# Patient Record
Sex: Female | Born: 1975 | Race: White | Hispanic: No | Marital: Single | Smoking: Never smoker
Health system: Southern US, Academic
[De-identification: ages and names within clinical notes are randomized; demographics above are authoritative.]

## PROBLEM LIST (undated history)

## (undated) DIAGNOSIS — IMO0002 Reserved for concepts with insufficient information to code with codable children: Secondary | ICD-10-CM

## (undated) DIAGNOSIS — N760 Acute vaginitis: Secondary | ICD-10-CM

## (undated) DIAGNOSIS — R51 Headache: Secondary | ICD-10-CM

## (undated) DIAGNOSIS — A64 Unspecified sexually transmitted disease: Secondary | ICD-10-CM

## (undated) DIAGNOSIS — R87619 Unspecified abnormal cytological findings in specimens from cervix uteri: Secondary | ICD-10-CM

## (undated) DIAGNOSIS — N879 Dysplasia of cervix uteri, unspecified: Secondary | ICD-10-CM

## (undated) HISTORY — DX: Acute vaginitis: N76.0

## (undated) HISTORY — DX: Dysplasia of cervix uteri, unspecified: N87.9

## (undated) HISTORY — DX: Headache: R51

## (undated) HISTORY — DX: Unspecified abnormal cytological findings in specimens from cervix uteri: R87.619

## (undated) HISTORY — DX: Unspecified sexually transmitted disease: A64

## (undated) HISTORY — DX: Reserved for concepts with insufficient information to code with codable children: IMO0002

---

## 1996-11-04 HISTORY — PX: HX CRYOSURGERY OF CERVIX: 2100001332

## 1998-11-04 HISTORY — PX: HX LEEP PROCEDURE: SHX91

## 2013-01-08 ENCOUNTER — Encounter (INDEPENDENT_AMBULATORY_CARE_PROVIDER_SITE_OTHER): Payer: Self-pay | Admitting: Women's Health

## 2013-01-08 ENCOUNTER — Ambulatory Visit (INDEPENDENT_AMBULATORY_CARE_PROVIDER_SITE_OTHER): Payer: BC Managed Care – PPO | Admitting: Women's Health

## 2013-01-08 VITALS — BP 116/74 | Ht 68.0 in | Wt 198.0 lb

## 2013-01-08 DIAGNOSIS — N889 Noninflammatory disorder of cervix uteri, unspecified: Secondary | ICD-10-CM | POA: Insufficient documentation

## 2013-01-08 LAB — PC POCT URINE PREG TEST, VISUAL: PREGNANCY, URINE: NEGATIVE

## 2013-01-08 NOTE — Procedures (Signed)
See scanned colposcopy sheet

## 2013-01-08 NOTE — Progress Notes (Signed)
See scanned colposcopy sheet

## 2013-01-19 ENCOUNTER — Encounter (INDEPENDENT_AMBULATORY_CARE_PROVIDER_SITE_OTHER): Payer: Self-pay | Admitting: Women's Health

## 2013-08-05 ENCOUNTER — Encounter (INDEPENDENT_AMBULATORY_CARE_PROVIDER_SITE_OTHER): Payer: Self-pay | Admitting: Women's Health

## 2013-09-02 ENCOUNTER — Encounter (INDEPENDENT_AMBULATORY_CARE_PROVIDER_SITE_OTHER): Payer: Self-pay | Admitting: Women's Health

## 2014-03-11 ENCOUNTER — Encounter (INDEPENDENT_AMBULATORY_CARE_PROVIDER_SITE_OTHER): Payer: Self-pay | Admitting: Women's Health

## 2014-05-23 ENCOUNTER — Other Ambulatory Visit (INDEPENDENT_AMBULATORY_CARE_PROVIDER_SITE_OTHER): Payer: Self-pay | Admitting: Women's Health

## 2014-05-23 MED ORDER — VALACYCLOVIR 500 MG TABLET
500.00 mg | ORAL_TABLET | Freq: Two times a day (BID) | ORAL | Status: DC
Start: 2014-05-23 — End: 2015-07-17

## 2014-05-23 NOTE — Telephone Encounter (Signed)
Pt calling states Dr. Vivia Birminghamepond diagnosed her with herpes in 2001 and she takes valtrex as needed for outbreaks in the vaginal area she states she is having one now and would like some called in

## 2014-06-21 ENCOUNTER — Ambulatory Visit (INDEPENDENT_AMBULATORY_CARE_PROVIDER_SITE_OTHER): Payer: BC Managed Care – PPO | Admitting: Women's Health

## 2014-06-21 ENCOUNTER — Encounter (INDEPENDENT_AMBULATORY_CARE_PROVIDER_SITE_OTHER): Payer: Self-pay | Admitting: Women's Health

## 2014-06-21 VITALS — BP 122/80 | Ht 68.0 in | Wt 207.0 lb

## 2014-06-21 DIAGNOSIS — Z01419 Encounter for gynecological examination (general) (routine) without abnormal findings: Secondary | ICD-10-CM

## 2014-06-21 DIAGNOSIS — A609 Anogenital herpesviral infection, unspecified: Secondary | ICD-10-CM

## 2014-06-21 DIAGNOSIS — B009 Herpesviral infection, unspecified: Secondary | ICD-10-CM

## 2014-06-21 MED ORDER — VALACYCLOVIR 500 MG TABLET
500.00 mg | ORAL_TABLET | Freq: Two times a day (BID) | ORAL | Status: DC
Start: 2014-06-21 — End: 2015-07-17

## 2014-06-21 NOTE — Progress Notes (Signed)
[facility]  [facility]    [facility] [place]  Date of Service: 06/21/2014    Chief Complaint:   Chief Complaint   Patient presents with    Annual Pap     NO ASSIST       History  HPI    Patient into office for annual exam.  Denies complaints.  Requesting refill on valtrex.  Not sexually active at this time    Review of Systems   Constitutional: Negative.    HENT: Negative.    Eyes: Negative.    Respiratory: Negative.    Cardiovascular: Negative.    Gastrointestinal: Negative.    Endocrine: Negative.    Genitourinary: Negative.    Musculoskeletal: Negative.    Skin: Negative.    Allergic/Immunologic: Negative.    Neurological: Negative.    Hematological: Negative.    Psychiatric/Behavioral: Negative.        Examination  Vitals: BP 122/80 mmHg   Ht 1.727 m (5\' 8" )   Wt 93.895 kg (207 lb)   BMI 31.48 kg/m2   LMP 06/16/2014 (Exact Date)  Physical Exam   Constitutional: She is oriented to person, place, and time. She appears well-developed and well-nourished.   HENT:   Head: Normocephalic.   Neck: Normal range of motion. No thyromegaly present.   Cardiovascular: Normal rate, regular rhythm and normal heart sounds.    No murmur heard.  Pulmonary/Chest: Effort normal and breath sounds normal. Right breast exhibits no mass, no nipple discharge, no skin change and no tenderness. Left breast exhibits no mass, no nipple discharge, no skin change and no tenderness.   Abdominal: Soft. There is no tenderness.   Genitourinary: Uterus normal. Pelvic exam was performed with patient supine. There is no lesion on the right labia. There is no lesion on the left labia. Cervix exhibits no motion tenderness. Right adnexum displays no mass, no tenderness and no fullness. Left adnexum displays no mass, no tenderness and no fullness. There is bleeding in the vagina. No vaginal discharge found.   Musculoskeletal: Normal range of motion.   Lymphadenopathy:        Right: No inguinal adenopathy present.        Left: No inguinal  adenopathy present.   Neurological: She is alert and oriented to person, place, and time.   Skin: Skin is warm and dry.   Psychiatric: She has a normal mood and affect. Her behavior is normal. Judgment and thought content normal.   Vitals reviewed.    Ortho Exam    Results    Diagnosis and Plan  1. Routine gynecological examination    2. HSV (herpes simplex virus) anogenital infection      Orders Placed This Encounter    valACYclovir (VALTREX) 500 mg Oral Tablet    CYTOPATHOLOGY-GYN (PAP AND HPV TESTS)       Bethena MidgetMichelle Maurizio Geno, NP

## 2014-06-27 ENCOUNTER — Other Ambulatory Visit (INDEPENDENT_AMBULATORY_CARE_PROVIDER_SITE_OTHER): Payer: Self-pay

## 2014-06-27 DIAGNOSIS — Z01419 Encounter for gynecological examination (general) (routine) without abnormal findings: Secondary | ICD-10-CM

## 2015-07-17 ENCOUNTER — Other Ambulatory Visit (INDEPENDENT_AMBULATORY_CARE_PROVIDER_SITE_OTHER): Payer: Self-pay | Admitting: Women's Health

## 2015-07-17 DIAGNOSIS — A609 Anogenital herpesviral infection, unspecified: Secondary | ICD-10-CM

## 2015-07-17 NOTE — Telephone Encounter (Signed)
Pt needs refill on Vatrex 500 mg she states the last time you sent RX it was BID. She would like to know if she could get the RX for TID? She states the last time she had to go through 2 RXs before everything cleared up.

## 2015-07-18 MED ORDER — VALACYCLOVIR 500 MG TABLET
500.0000 mg | ORAL_TABLET | Freq: Two times a day (BID) | ORAL | Status: DC
Start: 2015-07-18 — End: 2023-05-20

## 2021-11-06 ENCOUNTER — Ambulatory Visit (HOSPITAL_COMMUNITY): Payer: Self-pay | Admitting: Internal Medicine

## 2021-12-27 ENCOUNTER — Ambulatory Visit (RURAL_HEALTH_CENTER): Payer: Self-pay | Admitting: INTERNAL MEDICINE-ENDOCRINOLOGY-DIABETES AND METABOLISM

## 2023-03-10 ENCOUNTER — Telehealth (HOSPITAL_BASED_OUTPATIENT_CLINIC_OR_DEPARTMENT_OTHER): Payer: Self-pay | Admitting: Internal Medicine

## 2023-03-10 DIAGNOSIS — E0500 Thyrotoxicosis with diffuse goiter without thyrotoxic crisis or storm: Secondary | ICD-10-CM

## 2023-03-10 NOTE — Telephone Encounter (Signed)
Wants lab  results on the way from [facility]

## 2023-03-17 ENCOUNTER — Ambulatory Visit (HOSPITAL_BASED_OUTPATIENT_CLINIC_OR_DEPARTMENT_OTHER): Payer: Self-pay | Admitting: Internal Medicine

## 2023-05-26 ENCOUNTER — Encounter (HOSPITAL_BASED_OUTPATIENT_CLINIC_OR_DEPARTMENT_OTHER): Payer: Self-pay | Admitting: Internal Medicine

## 2023-05-26 ENCOUNTER — Ambulatory Visit: Payer: BC Managed Care – PPO | Attending: Internal Medicine | Admitting: Internal Medicine

## 2023-05-26 ENCOUNTER — Other Ambulatory Visit: Payer: Self-pay

## 2023-05-26 VITALS — BP 126/63 | HR 67 | Temp 97.0°F | Ht 68.0 in | Wt 172.0 lb

## 2023-05-26 DIAGNOSIS — E0500 Thyrotoxicosis with diffuse goiter without thyrotoxic crisis or storm: Secondary | ICD-10-CM

## 2023-05-26 DIAGNOSIS — Z8349 Family history of other endocrine, nutritional and metabolic diseases: Secondary | ICD-10-CM

## 2023-05-26 DIAGNOSIS — E042 Nontoxic multinodular goiter: Secondary | ICD-10-CM

## 2023-05-26 DIAGNOSIS — E559 Vitamin D deficiency, unspecified: Secondary | ICD-10-CM

## 2023-05-26 DIAGNOSIS — Z79890 Hormone replacement therapy (postmenopausal): Secondary | ICD-10-CM

## 2023-05-26 NOTE — Progress Notes (Signed)
[facility]  [address]  [place] [address]  Operated by [facility]  Progress Note    Name: Stephanie Shelton MRN:  [id number]   Date: 05/26/2023 DOB:  12/14/1975 (47 y.o.)              ENDOCRINOLOGY OUTPATIENT PROGRESS NOTE    Name: Stephanie [facility]  Age: 47 y.o., Sex: Female  MRN: [id number]    Referred by: No ref. 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 with high tsi on methimazole since jan 2023   noticed heart rate high went to dr no previous history of thyroid disease   labs in november showed tsh low ft4 2.1 ft3 high   since last visit 42m ago  no hospitalization no surgery taking methimazole 2.5mg  dose adjusted from 5mg  daily 45m ago   has family history of thyroid disease mom has thyroid nodule no family history of thyroid cancer and no history of irradiation to head and neck   last menstrual march 2022 is in menopause based on labs by obgyn and started estrace vaginal cream      Last ultrasound 07/13/22      Findings rt and left lobe  heterogenous   thyroid nodule rt side 5mm stable      Thyroid symptoms    Signs/Symptoms Yes No Comments   Hair/Skin/Nail Changes  x    Compressive Symptoms  x    Fatigue  x    Weakness   x    Edema  x    Weight Changes x  Gained 5lb    Menstrual Irregularities x  No period since march 2022    Bowel Changes  x    Heat/Cold Intolerance  x    Palpitations  x    Tremors  x    Neck Tenderness  x    Increased Neck Size  x    Diplopia  x    Scleral Injection      Proptosis      Dry Eyes      No insomnia no anxiety         Review of Systems  As per h&P    Allergies  Allergies   Allergen Reactions    Tetracyclines Mental Status Effect        Medication History   Current Outpatient Medications   Medication Sig    ergocalciferol, vitamin D2, (DRISDOL) 1,250 mcg (50,000 unit) Oral Capsule Take 1 Capsule (50,000 Units  total) by mouth Every 7 days    estradioL (ESTRACE) 0.01 % (0.1 mg/gram) Vaginal Cream insert 1 gram vaginally at bedtime nightly x 2 weeks then 2-3 times weekly as a maintenance    methIMAzole (TAPAZOLE) 5 mg Oral Tablet Take 0.5 Tablets (2.5 mg total) by mouth Once a day        OBJECTIVE:     Vitals:    05/26/23 1406   BP: 126/63   Pulse: 67   Temp: 36.1 C (97 F)   SpO2: 99%   Weight: 78 kg (172 lb)   Height: 1.727 m (5\' 8" )   BMI: 26.21       Physical Exam  Constitutional:       Appearance: Normal appearance. She is normal weight.   HENT:      Head: Normocephalic.   Neck:  Thyroid: Thyromegaly (firm and irregular) present. No thyroid mass or thyroid tenderness.   Cardiovascular:      Rate and Rhythm: Normal rate and regular rhythm.      Heart sounds: Normal heart sounds. No murmur heard.  Pulmonary:      Effort: Pulmonary effort is normal.      Breath sounds: Normal breath sounds.   Lymphadenopathy:      Cervical: No cervical adenopathy.   Neurological:      General: No focal deficit present.      Mental Status: She is alert and oriented to person, place, and time.      Cranial Nerves: No cranial nerve deficit.      Motor: No tremor.   Psychiatric:         Attention and Perception: Attention normal.         Mood and Affect: Mood normal.         Speech: Speech normal.         Behavior: Behavior normal. Behavior is cooperative.         Cognition and Memory: Cognition and memory normal.         Judgment: Judgment normal.          Lab Investigations and Imaging  .April 29 TSH 1.9  wbc 6.0 ast 15     ASSESSMENT & PLAN:   (E05.00) Graves' disease  (primary encounter diagnosis)  Plan: THYROXINE, FREE (FREE T4), THYROID STIMULATING         HORMONE WITH FREE T4 REFLEX, THYROXINE, FREE         (FREE T4), THYROID STIMULATING HORMONE WITH         FREE T4 REFLEX    (E55.9) Vitamin D deficiency    (E04.2) Multinodular goiter       ICD-10-CM    1. Graves' disease  E05.00 THYROXINE, FREE (FREE T4)     THYROID STIMULATING  HORMONE WITH FREE T4 REFLEX     THYROXINE, FREE (FREE T4)     THYROID STIMULATING HORMONE WITH FREE T4 REFLEX      2. Vitamin D deficiency  E55.9       3. Multinodular goiter  E04.2        Doing well will stop methimazole   Repeat tfts in 18m and call   RTC 80m with labs       Return in about 6 months (around 11/26/2023) for In Person Visit.    Andreas Blower, MD

## 2023-11-19 IMAGING — MR MRI SHOULDER LT W/O CONTRAST
4 of 7 series · 27 of 40 positions shown · IV contrast (gadolinium)
Comparison: Two views of the left shoulder dated 11/19/2023.

﻿EXAM:  [id number]   MRI SHOULDER LT W/O CONTRAST
INDICATION: 47-year-old with persistent left shoulder pain and diminished range of motion.  Possible internal derangement.  No prior history of shoulder surgery. History of C-spine fracture.
TECHNIQUE: Multiplanar, multisequential MRI of the left shoulder was performed without gadolinium contrast.

[Series 6: T1 · oblique · left · 4.5mm · 0.38mm/px · 7 of 20 slices shown]
[im 1/20]
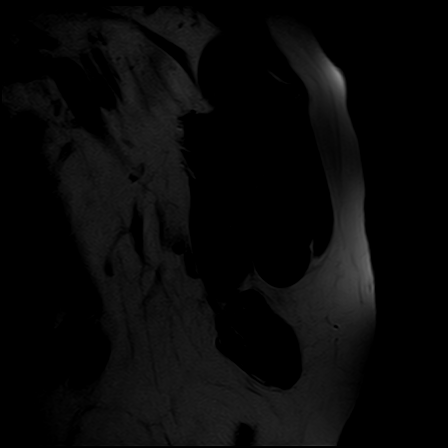
[im 4/20]
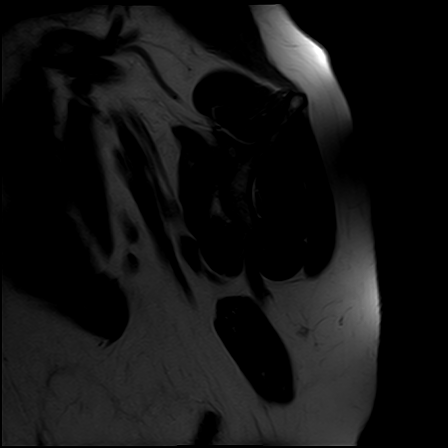
[im 7/20]
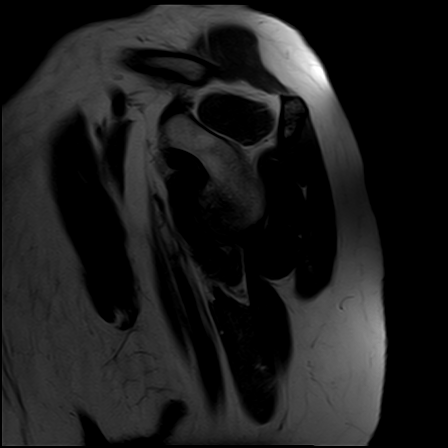
[im 10/20]
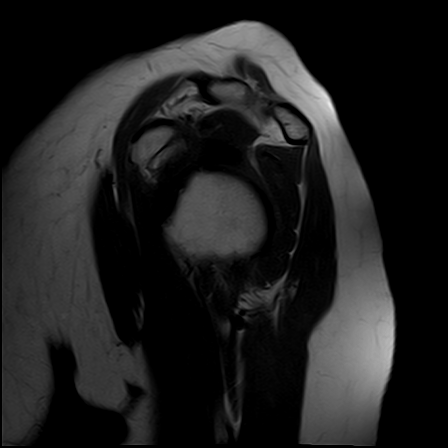
[im 13/20]
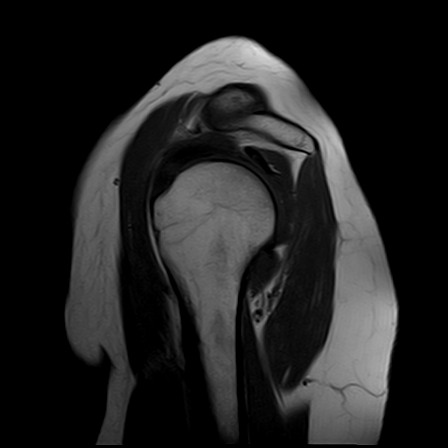
[im 16/20]
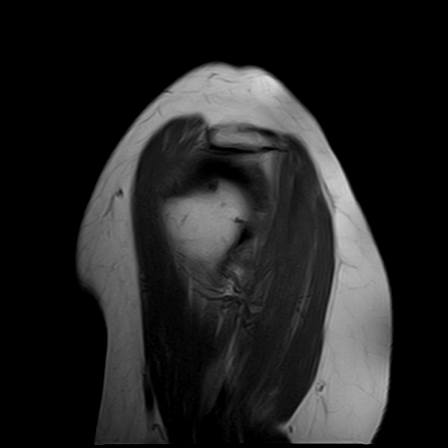
[im 20/20]
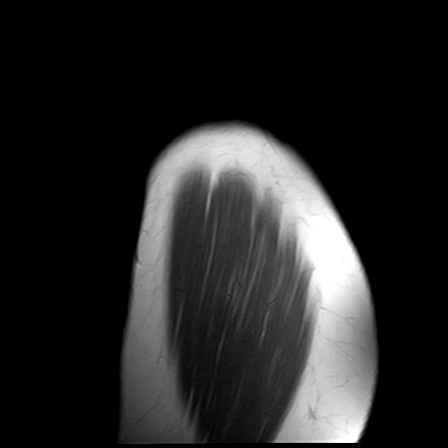

[Series 7: PD fat-sat · axial · left · 5.5mm · 0.50mm/px · z∈[-72,+12]mm · 6 of 18 slices shown (1 of 2)]
[im 1/18]
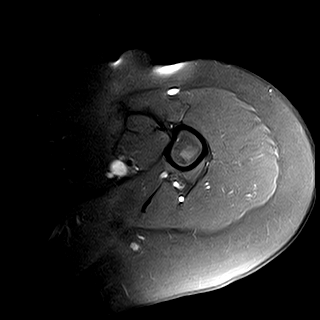
[im 4/18]
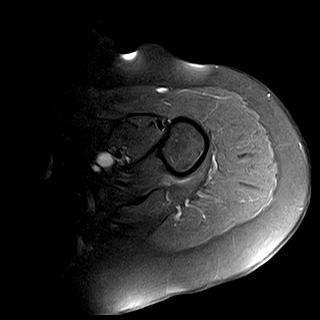
[im 7/18]
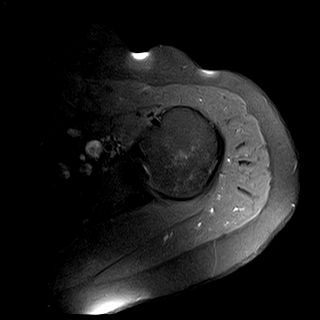
[im 11/18]
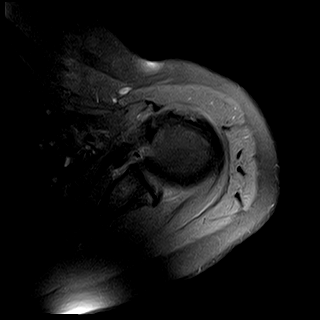
[im 14/18]
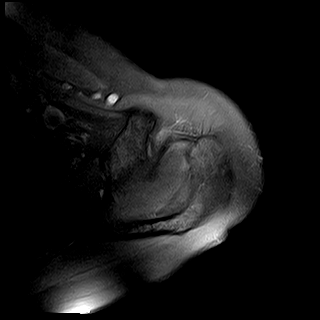
[im 18/18]
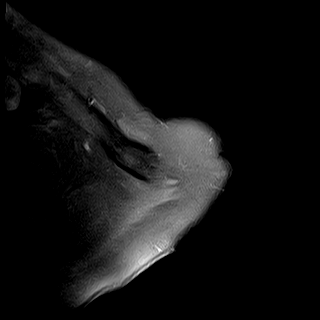

[Series 9: T2 fat-sat · axial · left · 4.0mm · 0.42mm/px · z∈[-67,+19]mm · 8 of 24 slices shown]
[im 1/24]
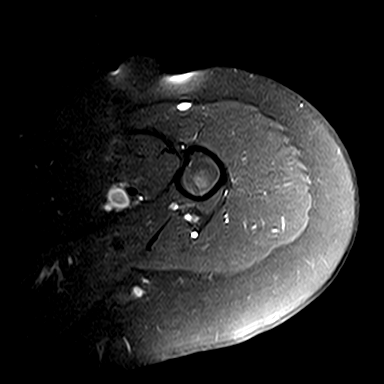
[im 4/24]
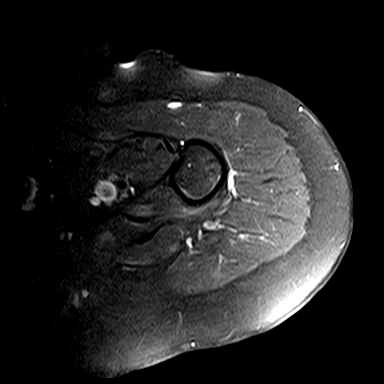
[im 7/24]
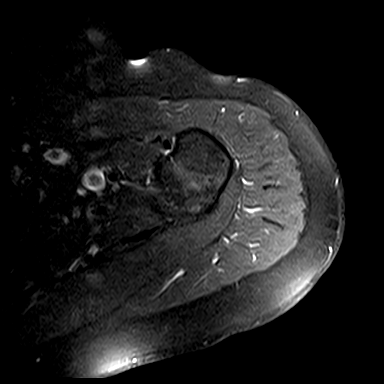
[im 10/24]
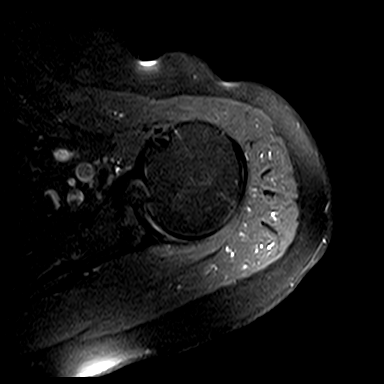
[im 14/24]
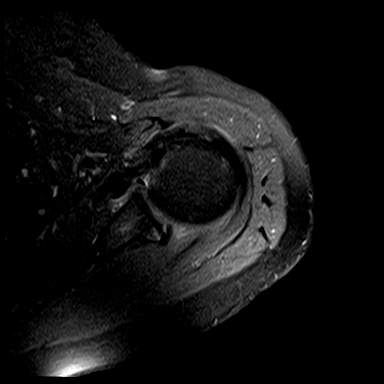
[im 17/24]
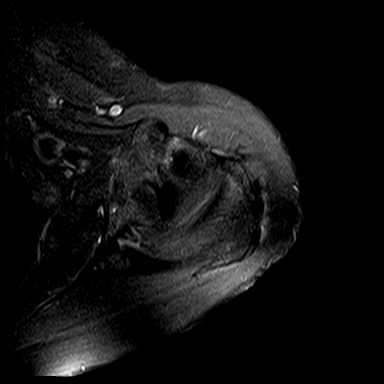
[im 20/24]
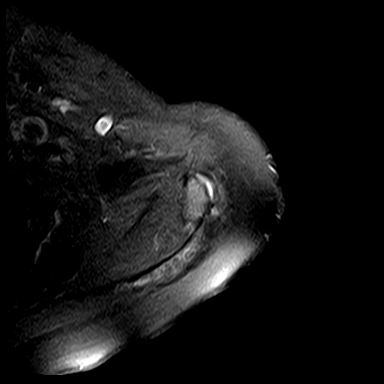
[im 24/24]
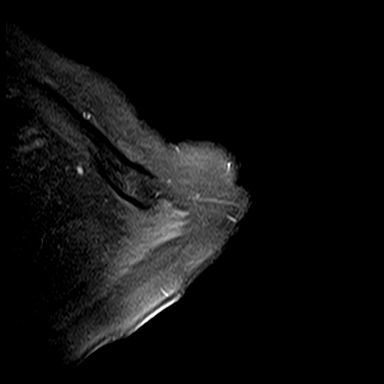

[Series 10: PD fat-sat · coronal · left · 4.5mm · 0.47mm/px · 6 of 18 slices shown (2 of 2)]
[im 1/18]
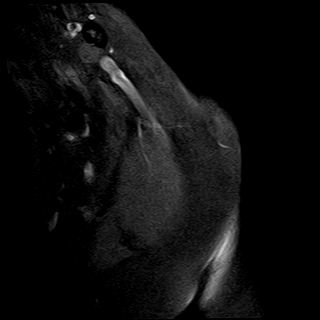
[im 4/18]
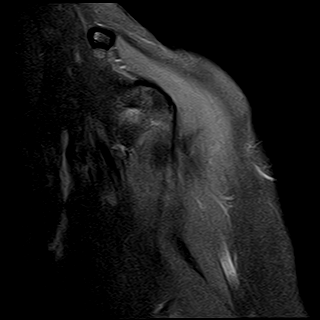
[im 7/18]
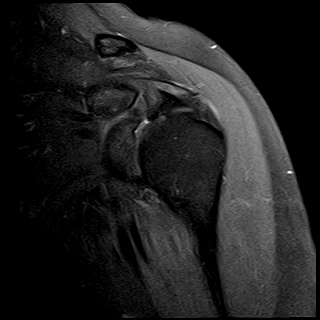
[im 11/18]
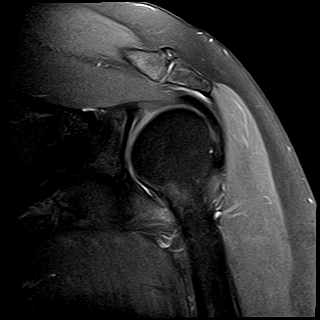
[im 14/18]
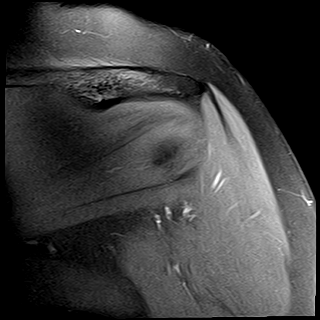
[im 18/18]
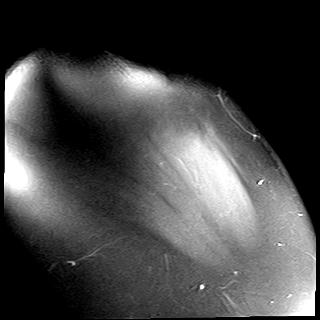

[27 of 40 positions shown; findings below may reference images not displayed]

FINDINGS: No acute bony lesions at the left shoulder.  Hook shaped acromion process is mildly impinging on the subacromion space and the supraspinatus.  Mild supraspinatus tendinitis.  Minimal fluid in the subacromion bursa.

No evidence of glenoid labral tear. Mild degenerative changes of the anterior lip of the glenoid labrum, without evidence of labral tear. Long head of biceps tendon is intact.
IMPRESSION: 1. No acute bony lesions at the left shoulder.

2. Hook shaped acromion process is mildly impinging on the subacromion space and the supraspinatus.  Mild supraspinatus tendinitis.  No evidence of rotator cuff tear.

3. Mild degenerative changes of the anterior lip of the glenoid labrum.  No evidence of labral tear.  Biceps tendon is intact.

## 2023-11-26 ENCOUNTER — Ambulatory Visit: Payer: BC Managed Care – PPO | Attending: Internal Medicine | Admitting: Internal Medicine

## 2023-11-26 ENCOUNTER — Encounter (HOSPITAL_BASED_OUTPATIENT_CLINIC_OR_DEPARTMENT_OTHER): Payer: Self-pay | Admitting: Internal Medicine

## 2023-11-26 ENCOUNTER — Other Ambulatory Visit: Payer: Self-pay

## 2023-11-26 ENCOUNTER — Other Ambulatory Visit (HOSPITAL_BASED_OUTPATIENT_CLINIC_OR_DEPARTMENT_OTHER): Payer: Self-pay

## 2023-11-26 VITALS — BP 130/71 | HR 79 | Temp 98.2°F | Ht 68.0 in | Wt 196.0 lb

## 2023-11-26 DIAGNOSIS — E0500 Thyrotoxicosis with diffuse goiter without thyrotoxic crisis or storm: Secondary | ICD-10-CM | POA: Insufficient documentation

## 2023-11-26 DIAGNOSIS — E559 Vitamin D deficiency, unspecified: Secondary | ICD-10-CM | POA: Insufficient documentation

## 2023-11-26 DIAGNOSIS — E042 Nontoxic multinodular goiter: Secondary | ICD-10-CM | POA: Insufficient documentation

## 2023-11-26 DIAGNOSIS — Z8349 Family history of other endocrine, nutritional and metabolic diseases: Secondary | ICD-10-CM

## 2023-11-26 NOTE — Progress Notes (Signed)
[facility]  [address]  [place] [address]  Operated by [facility]  Progress Note    Name: Stephanie [facility] MRN:  [id number]   Date: 11/26/2023 DOB:  05-Sep-1976 (48 y.o.)              ENDOCRINOLOGY OUTPATIENT PROGRESS NOTE    Name: Stephanie Shel[facility]  Age: 48 y.o., Sex: Female  MRN: [id number]    Referred by: No ref. 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 with high tsi on methimazole since jan 2023   noticed heart rate high went to dr no previous history of thyroid disease   labs in november showed tsh low ft4 2.1 ft3 high   since last visit 45m ago  no hospitalization no surgery was  taking methimazole 2.5mg  but it was stopped July 2024   has family history of thyroid disease mom has thyroid nodule no family history of thyroid cancer and no history of irradiation to head and neck   last menstrual march 2022 is in menopause based on labs by obgyn and started estrace vaginal cream      Last ultrasound 07/13/22      Findings rt and left lobe  heterogenous   thyroid nodule rt side 5mm stable      Thyroid symptoms    Signs/Symptoms Yes No Comments   Hair/Skin/Nail Changes  x    Compressive Symptoms  x    Fatigue  x    Weakness   x    Edema  x    Weight Changes x  Gained 24 lb    Menstrual Irregularities x  No period since march 2022    Bowel Changes  x    Heat/Cold Intolerance  x    Palpitations  x    Tremors  x    Neck Tenderness  x    Increased Neck Size  x    Diplopia  x    Scleral Injection      Proptosis      Dry Eyes      No insomnia no anxiety         Review of Systems  As per h&P    Allergies  Allergies   Allergen Reactions    Tetracyclines Mental Status Effect        Medication History   Current Outpatient Medications   Medication Sig    amoxicillin (AMOXIL) 875 mg Oral Tablet Take 1 Tablet (875 mg total) by mouth Twice daily    ergocalciferol,  vitamin D2, (DRISDOL) 1,250 mcg (50,000 unit) Oral Capsule Take 1 Capsule (50,000 Units total) by mouth Every 7 days    estradioL (ESTRACE) 0.01 % (0.1 mg/gram) Vaginal Cream insert 1 gram vaginally at bedtime nightly x 2 weeks then 2-3 times weekly as a maintenance        OBJECTIVE:     Vitals:    11/26/23 1439   BP: 130/71   Pulse: 79   Temp: 36.8 C (98.2 F)   SpO2: 97%   Weight: 88.9 kg (196 lb)   Height: 1.727 m (5\' 8" )   BMI: 29.8         Physical Exam  Constitutional:       Appearance: Normal appearance. She is normal weight.   HENT:      Head: Normocephalic.   Neck:  Thyroid: Thyromegaly (firm and irregular) present. No thyroid mass or thyroid tenderness.   Cardiovascular:      Rate and Rhythm: Normal rate and regular rhythm.      Heart sounds: Normal heart sounds. No murmur heard.  Pulmonary:      Effort: Pulmonary effort is normal.      Breath sounds: Normal breath sounds.   Lymphadenopathy:      Cervical: No cervical adenopathy.   Neurological:      General: No focal deficit present.      Mental Status: She is alert and oriented to person, place, and time.      Cranial Nerves: No cranial nerve deficit.      Motor: No tremor.   Psychiatric:         Attention and Perception: Attention normal.         Mood and Affect: Mood normal.         Speech: Speech normal.         Behavior: Behavior normal. Behavior is cooperative.         Cognition and Memory: Cognition and memory normal.         Judgment: Judgment normal.          Lab Investigations and Imaging  .April 29 TSH 1.9  wbc 6.0 ast 15   9/3 TSH 1.77   11/25/2023 TSH 1.092 ft4 1.10   ASSESSMENT & PLAN:   (E05.00) Graves' disease  (primary encounter diagnosis)  Plan: THYROXINE, FREE (FREE T4), THYROID STIMULATING         HORMONE WITH FREE T4 REFLEX, THYROXINE, FREE         (FREE T4), THYROID STIMULATING HORMONE WITH         FREE T4 REFLEX    (E04.2) Multinodular goiter    (E55.9) Vitamin D deficiency       ICD-10-CM    1. Graves' disease  E05.00  THYROXINE, FREE (FREE T4)     THYROID STIMULATING HORMONE WITH FREE T4 REFLEX     THYROXINE, FREE (FREE T4)     THYROID STIMULATING HORMONE WITH FREE T4 REFLEX      2. Multinodular goiter  E04.2       3. Vitamin D deficiency  E55.9        Off methimazole in remission   RTC 1 year with labs   If symptoms recurs  get tfts and call     Return in about 1 year (around 11/25/2024) for In Person Visit.    Andreas Blower, MD              Review of Systems

## 2023-12-01 ENCOUNTER — Other Ambulatory Visit (HOSPITAL_BASED_OUTPATIENT_CLINIC_OR_DEPARTMENT_OTHER): Payer: Self-pay

## 2023-12-01 DIAGNOSIS — E0500 Thyrotoxicosis with diffuse goiter without thyrotoxic crisis or storm: Secondary | ICD-10-CM

## 2024-11-25 ENCOUNTER — Encounter (HOSPITAL_BASED_OUTPATIENT_CLINIC_OR_DEPARTMENT_OTHER): Payer: Self-pay | Admitting: Internal Medicine

## 2024-11-25 ENCOUNTER — Ambulatory Visit: Payer: Self-pay | Attending: Internal Medicine | Admitting: Internal Medicine

## 2024-11-25 ENCOUNTER — Other Ambulatory Visit: Payer: Self-pay

## 2024-11-25 VITALS — BP 130/88 | HR 71 | Temp 98.1°F | Ht 68.0 in | Wt 176.0 lb

## 2024-11-25 DIAGNOSIS — E042 Nontoxic multinodular goiter: Secondary | ICD-10-CM | POA: Insufficient documentation

## 2024-11-25 DIAGNOSIS — E0500 Thyrotoxicosis with diffuse goiter without thyrotoxic crisis or storm: Secondary | ICD-10-CM | POA: Insufficient documentation

## 2024-11-25 NOTE — Progress Notes (Signed)
 [facility]  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 with high tsi on methimazole since jan 2023   noticed heart rate high went to dr no previous history of thyroid  disease   labs in november showed tsh low ft4 2.1 ft3 high was taking methimazole 2.5mg  ut stopped July 2024   since last visit 1 year  ago  no hospitalization no surgery   has family history of thyroid  disease mom has thyroid  nodule no family history of thyroid  cancer and no history of irradiation to head and neck   last menstrual march 2022 is in menopause based on labs by obgyn and started estrace vaginal cream    but had another period July 2025   Last ultrasound 07/13/22      Findings rt and left lobe  heterogenous   thyroid  nodule rt side 5mm stable      Thyroid  symptoms    Signs/Symptoms Yes No Comments   Hair/Skin/Nail Changes  x    Compressive Symptoms  x    Fatigue  x    Weakness   x    Edema  x    Weight Changes x  Lost 20lb   Menstrual Irregularities x  No period since march 2022  then had 1 period July 2025    Bowel Changes  x    Heat/Cold Intolerance  x    Palpitations  x Occ    Tremors  x    Neck Tenderness  x    Increased Neck Size  x    Diplopia  x    Scleral Injection      Proptosis      Dry Eyes      No insomnia no anxiety         Review of Systems  As per h&P    Allergies  Allergies   Allergen Reactions    Tetracyclines Mental Status Effect        Medication History   Current Outpatient Medications   Medication Sig    ergocalciferol, vitamin D2, (DRISDOL) 1,250 mcg (50,000 unit)  Oral Capsule Take 1 Capsule (50,000 Units total) by mouth Every 7 days    estradioL (ESTRACE) 0.01 % (0.1 mg/gram) Vaginal Cream insert 1 gram vaginally at bedtime nightly x 2 weeks then 2-3 times weekly as a maintenance    metoprolol succinate (TOPROL-XL) 25 mg Oral Tablet Sustained Release 24 hr Take 0.5 mg by mouth Daily        OBJECTIVE:     Vitals:    11/25/24 1430   BP: 130/88   Pulse: 71   Temp: 36.7 C (98.1 F)   SpO2: 99%   Weight: 79.8 kg (176 lb)   Height: 1.727 m (5' 8)   BMI: 26.76           Physical Exam  Constitutional:       Appearance: Normal appearance. She is normal weight.   HENT:  Head: Normocephalic.   Neck:      Thyroid : Thyromegaly (firm and irregular) present. No thyroid  mass or thyroid  tenderness.   Cardiovascular:      Rate and Rhythm: Normal rate and regular rhythm.      Heart sounds: Normal heart sounds. No murmur heard.  Pulmonary:      Effort: Pulmonary effort is normal.      Breath sounds: Normal breath sounds.   Lymphadenopathy:      Cervical: No cervical adenopathy.   Neurological:      General: No focal deficit present.      Mental Status: She is alert and oriented to person, place, and time.      Cranial Nerves: No cranial nerve deficit.      Motor: No tremor.   Psychiatric:         Attention and Perception: Attention normal.         Mood and Affect: Mood normal.         Speech: Speech normal.         Behavior: Behavior normal. Behavior is cooperative.         Cognition and Memory: Cognition and memory normal.         Judgment: Judgment normal.          Lab Investigations and Imaging  .April 29 TSH 1.9  wbc 6.0 ast 15   9/3 TSH 1.77   11/25/2023 TSH 1.092 ft4 1.10   08/18/2024 TSH 1.3 ft4 1.62 vitd 34 ft3 3.5 tpo high    11/22/2024 ft4 1.13tsh 0.947  ASSESSMENT & PLAN:   (E04.2) Multinodular goiter  (primary encounter diagnosis)  Plan: THYROXINE, FREE (FREE T4), THYROID  STIMULATING         HORMONE WITH FREE T4 REFLEX    (E05.00) Graves' disease  Plan: THYROXINE, FREE (FREE  T4), THYROID  STIMULATING         HORMONE WITH FREE T4 REFLEX         ICD-10-CM    1. Multinodular goiter  E04.2 THYROXINE, FREE (FREE T4)     THYROID  STIMULATING HORMONE WITH FREE T4 REFLEX      2. Graves' disease  E05.00 THYROXINE, FREE (FREE T4)     THYROID  STIMULATING HORMONE WITH FREE T4 REFLEX         Off methimazole in remission   RTC 1 year with labs   If symptoms recurs  get tfts and call     Return in about 1 year (around 11/25/2025) for In Person Visit.    Erick Isles, MD

## 2024-11-30 ENCOUNTER — Other Ambulatory Visit (HOSPITAL_BASED_OUTPATIENT_CLINIC_OR_DEPARTMENT_OTHER): Payer: Self-pay

## 2025-11-24 ENCOUNTER — Ambulatory Visit (HOSPITAL_BASED_OUTPATIENT_CLINIC_OR_DEPARTMENT_OTHER): Payer: Self-pay | Admitting: Internal Medicine
